# Patient Record
Sex: Female | Born: 1985 | Smoking: Never smoker
Health system: Southern US, Community
[De-identification: ages and names within clinical notes are randomized; demographics above are authoritative.]

## PROBLEM LIST (undated history)

## (undated) DIAGNOSIS — Z8742 Personal history of other diseases of the female genital tract: Secondary | ICD-10-CM

## (undated) DIAGNOSIS — F988 Other specified behavioral and emotional disorders with onset usually occurring in childhood and adolescence: Secondary | ICD-10-CM

---

## 2006-01-23 ENCOUNTER — Emergency Department: Payer: Self-pay | Admitting: Emergency Medicine

## 2006-03-17 ENCOUNTER — Emergency Department (HOSPITAL_COMMUNITY): Admission: EM | Admit: 2006-03-17 | Discharge: 2006-03-17 | Payer: Self-pay | Admitting: Emergency Medicine

## 2006-09-12 ENCOUNTER — Emergency Department (HOSPITAL_COMMUNITY): Admission: EM | Admit: 2006-09-12 | Discharge: 2006-09-12 | Payer: Self-pay | Admitting: Emergency Medicine

## 2007-07-31 IMAGING — CT CT HEAD W/O CM
1 series · 16 of 30 positions shown, 20 images · non-contrast
Comparison: None.

CLINICAL DATA: Headache and blurred vision after falling and hitting the left
side of her head last night.

HEAD CT WITHOUT CONTRAST
TECHNIQUE: 5mm collimated images were obtained from the base of the skull
through the vertex, according to standard protocol, without contrast.

[Series 2: headseq 4.8 h45s · axial · 0.43mm/px · z∈[-155,-27]mm · 16 of 30 slices shown, 20 images]
[im 2/30  brain]
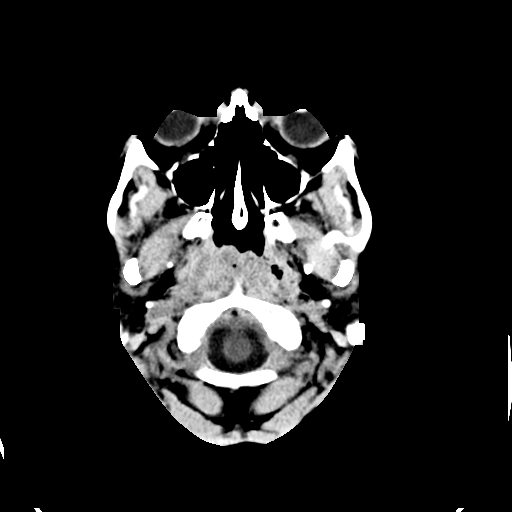
[im 2/30  bone]
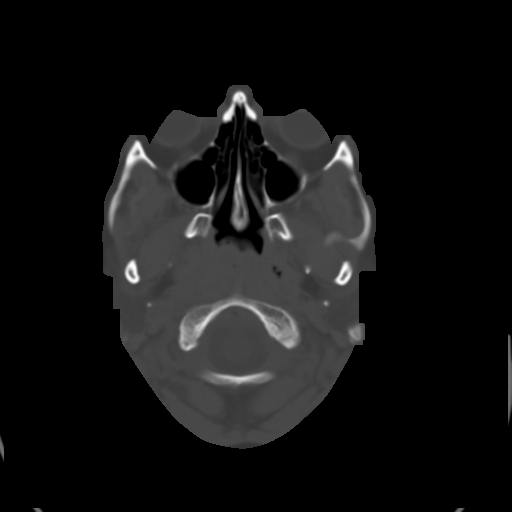
[im 4/30  brain]
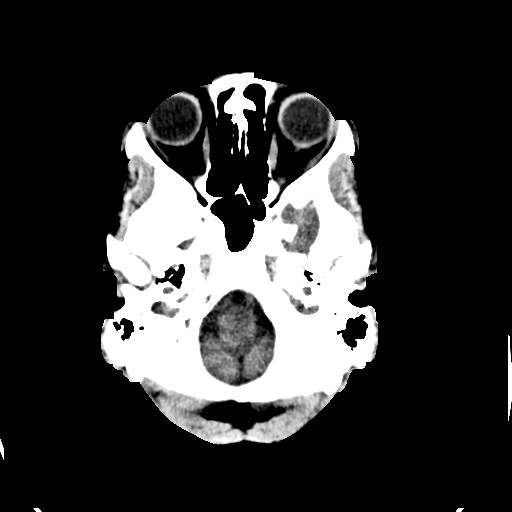
[im 6/30  brain]
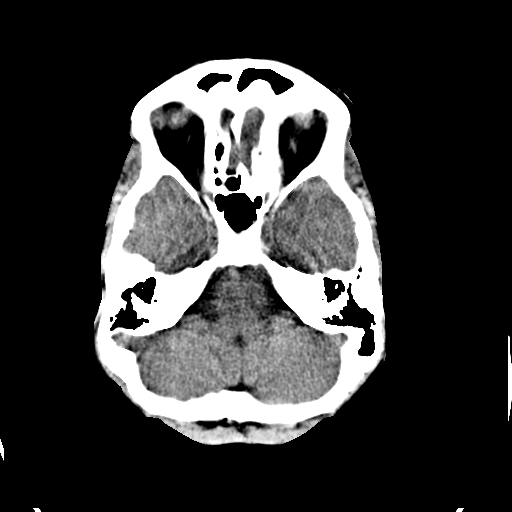
[im 8/30  brain]
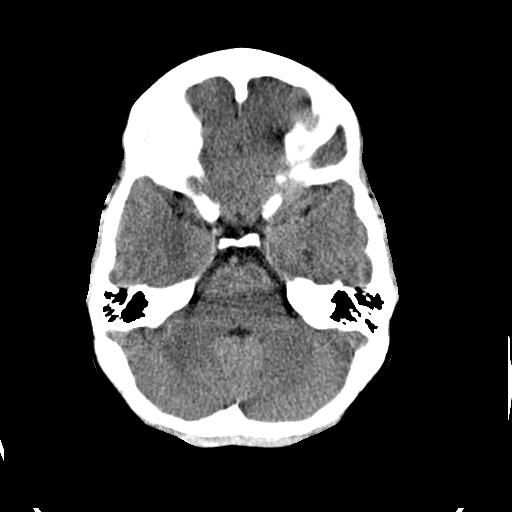
[im 9/30  brain]
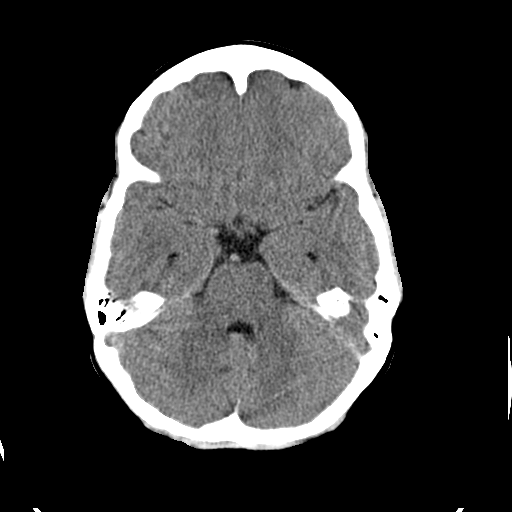
[im 9/30  bone]
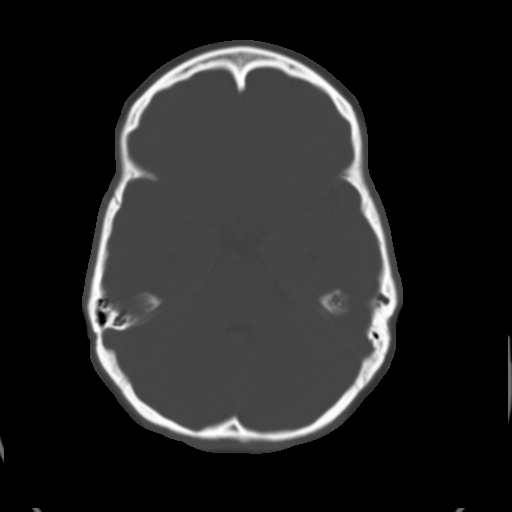
[im 11/30  brain]
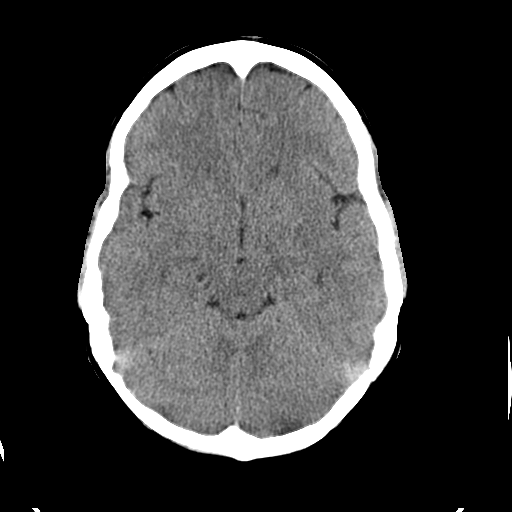
[im 13/30  brain]
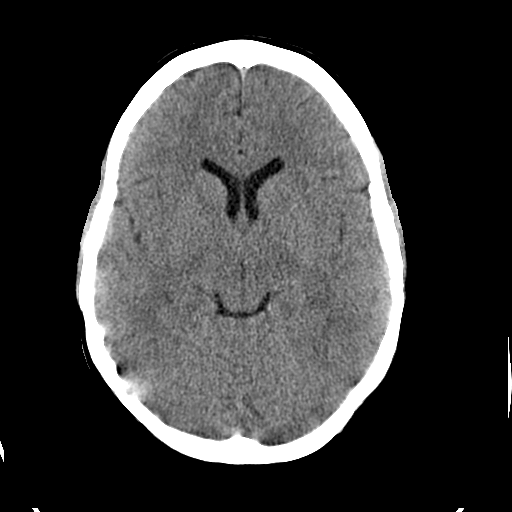
[im 15/30  brain]
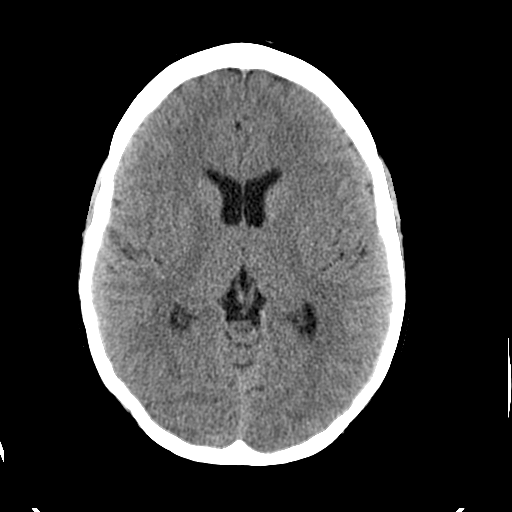
[im 16/30  brain]
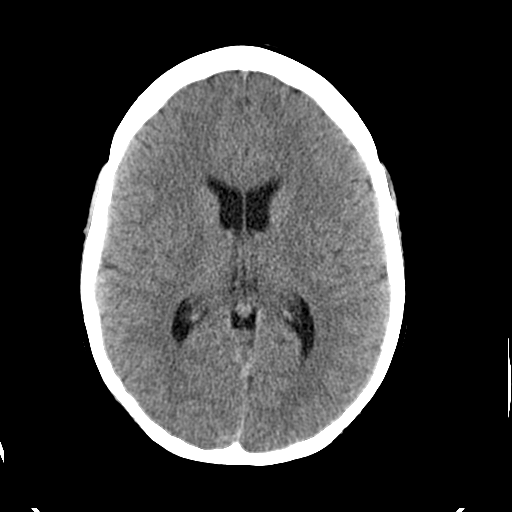
[im 16/30  bone]
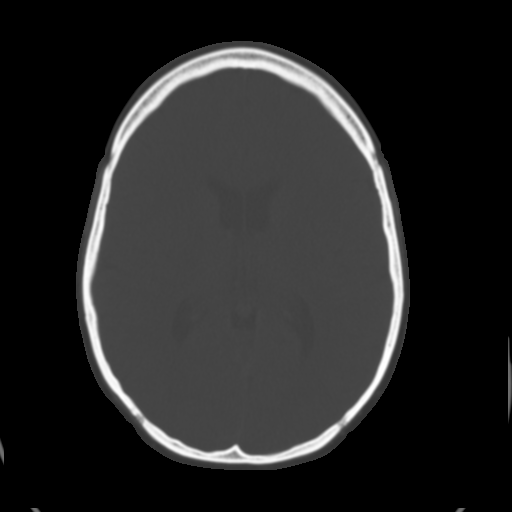
[im 18/30  brain]
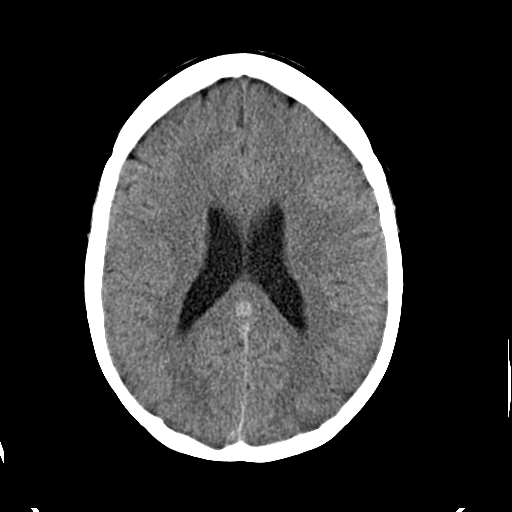
[im 20/30  brain]
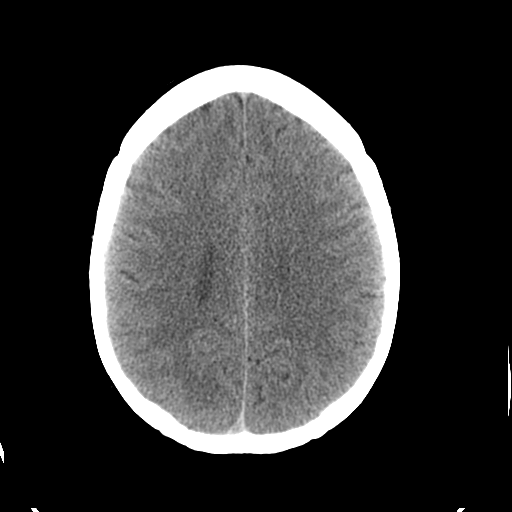
[im 22/30  brain]
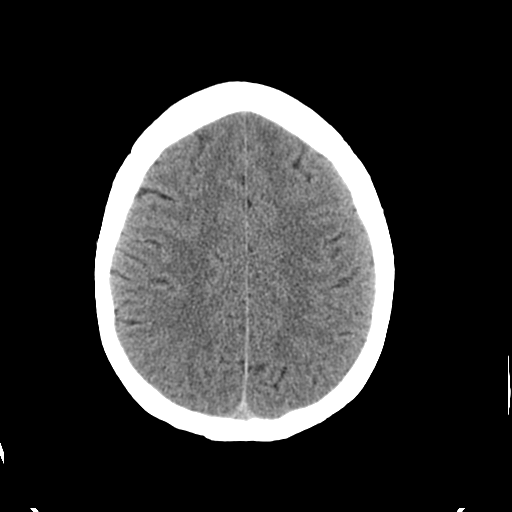
[im 23/30  brain]
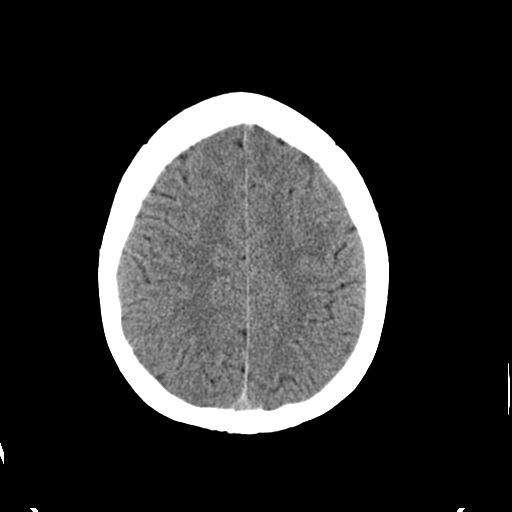
[im 23/30  bone]
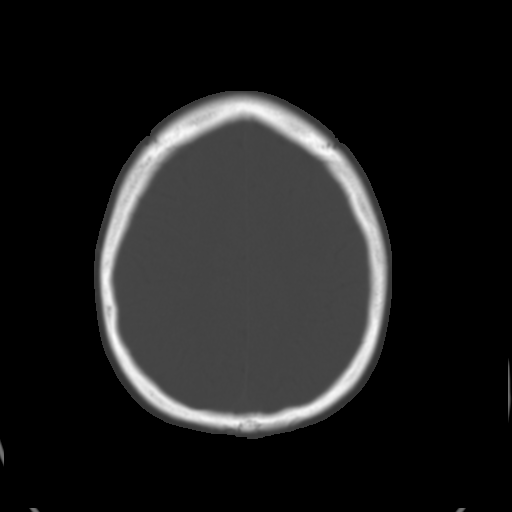
[im 25/30  brain]
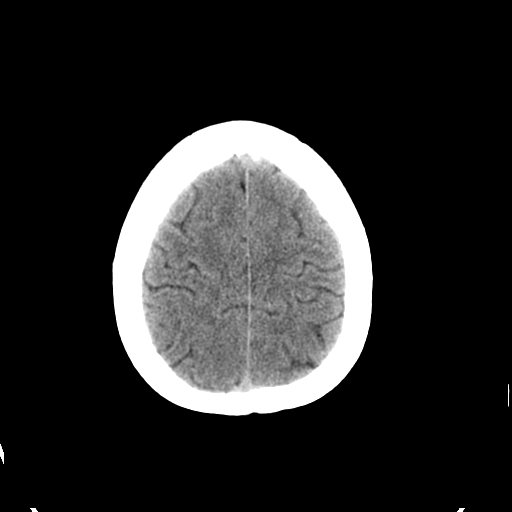
[im 27/30  brain]
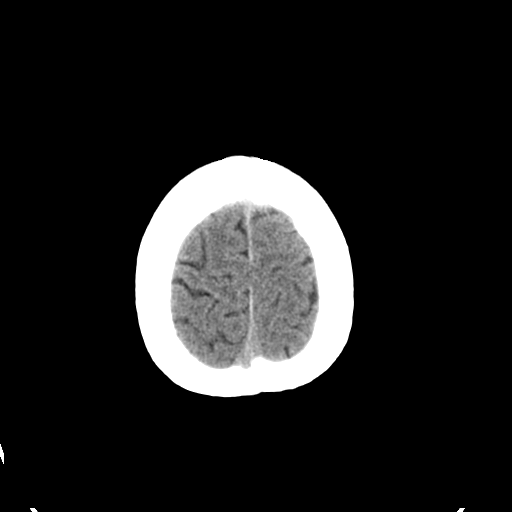
[im 29/30  brain]
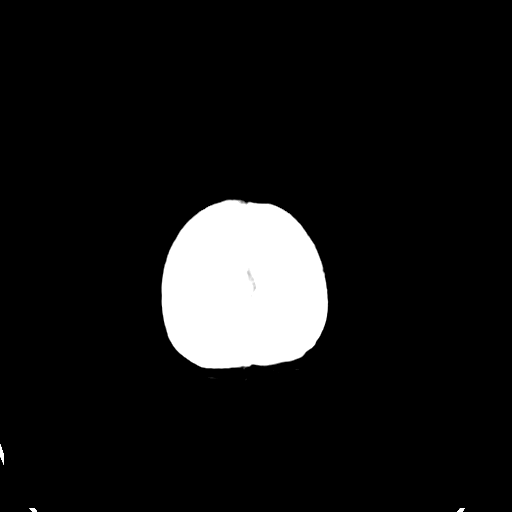

[16 of 30 positions shown; findings below may reference images not displayed]

FINDINGS: Normal appearing cerebral hemispheres and posterior fossa structures.
Normal size and position of the ventricles.  No skull fracture, intracranial
hemorrhage, mass effect or paranasal sinus air/fluid levels.

IMPRESSION

Normal examination.

## 2007-10-03 DIAGNOSIS — F988 Other specified behavioral and emotional disorders with onset usually occurring in childhood and adolescence: Secondary | ICD-10-CM

## 2007-10-03 HISTORY — DX: Other specified behavioral and emotional disorders with onset usually occurring in childhood and adolescence: F98.8

## 2018-10-01 ENCOUNTER — Inpatient Hospital Stay (HOSPITAL_COMMUNITY)
Admission: AD | Admit: 2018-10-01 | Discharge: 2018-10-01 | Disposition: A | Discharge status: Home / Self Care | Payer: Managed Care, Other (non HMO) | Attending: Obstetrics & Gynecology | Admitting: Obstetrics & Gynecology

## 2018-10-01 ENCOUNTER — Other Ambulatory Visit: Payer: Self-pay

## 2018-10-01 ENCOUNTER — Encounter (HOSPITAL_COMMUNITY): Payer: Self-pay | Admitting: *Deleted

## 2018-10-01 DIAGNOSIS — N939 Abnormal uterine and vaginal bleeding, unspecified: Secondary | ICD-10-CM | POA: Insufficient documentation

## 2018-10-01 DIAGNOSIS — T839XXA Unspecified complication of genitourinary prosthetic device, implant and graft, initial encounter: Secondary | ICD-10-CM | POA: Insufficient documentation

## 2018-10-01 HISTORY — DX: Other specified behavioral and emotional disorders with onset usually occurring in childhood and adolescence: F98.8

## 2018-10-01 LAB — CBC
HCT: 40.3 % (ref 36.0–46.0)
Hemoglobin: 12.9 g/dL (ref 12.0–15.0)
MCH: 29.1 pg (ref 26.0–34.0)
MCHC: 32 g/dL (ref 30.0–36.0)
MCV: 90.8 fL (ref 80.0–100.0)
PLATELETS: 304 10*3/uL (ref 150–400)
RBC: 4.44 MIL/uL (ref 3.87–5.11)
RDW: 13 % (ref 11.5–15.5)
WBC: 6.5 10*3/uL (ref 4.0–10.5)
nRBC: 0 % (ref 0.0–0.2)

## 2018-10-01 LAB — POCT PREGNANCY, URINE: PREG TEST UR: NEGATIVE

## 2018-10-01 MED ORDER — KETOROLAC TROMETHAMINE 60 MG/2ML IM SOLN
60.0000 mg | Freq: Once | INTRAMUSCULAR | Status: AC
  Administered 2018-10-01: 60 mg via INTRAMUSCULAR
  Filled 2018-10-01: qty 2

## 2018-10-01 MED ORDER — NORGESTIMATE-ETH ESTRADIOL 0.25-35 MG-MCG PO TABS: 1.0000 | ORAL_TABLET | Freq: Every day | ORAL | 1 refills | Status: AC

## 2018-10-01 NOTE — MAU Note (Signed)
Pt stated she started what she though was her normal period. Has Jean Bates. Periods have been heavy (Placed October 2019) since it was place. Pt stated for the pass fer days she has been changing tampons hourly. This morning she passed 2 large blood clots and saw her IUD in one of them. Having some abd pain and cramping.

## 2018-10-01 NOTE — Discharge Instructions (Signed)
Abnormal Uterine Bleeding  Abnormal uterine bleeding means bleeding more than usual from your uterus. It can include:   Bleeding between periods.   Bleeding after sex.   Bleeding that is heavier than normal.   Periods that last longer than usual.   Bleeding after you have stopped having your period (menopause).  There are many problems that may cause this. You should see a doctor for any kind of bleeding that is not normal. Treatment depends on the cause of the bleeding.  Follow these instructions at home:   Watch your condition for any changes.   Do not use tampons, douche, or have sex, if your doctor tells you not to.   Change your pads often.   Get regular well-woman exams. Make sure they include a pelvic exam and cervical cancer screening.   Keep all follow-up visits as told by your doctor. This is important.  Contact a doctor if:   The bleeding lasts more than one week.   You feel dizzy at times.   You feel like you are going to throw up (nauseous).   You throw up.  Get help right away if:   You pass out.   You have to change pads every hour.   You have belly (abdominal) pain.   You have a fever.   You get sweaty.   You get weak.   You passing large blood clots from your vagina.  Summary   Abnormal uterine bleeding means bleeding more than usual from your uterus.   There are many problems that may cause this. You should see a doctor for any kind of bleeding that is not normal.   Treatment depends on the cause of the bleeding.  This information is not intended to replace advice given to you by your health care provider. Make sure you discuss any questions you have with your health care provider.  Document Released: 07/16/2009 Document Revised: 09/12/2016 Document Reviewed: 09/12/2016  Elsevier Interactive Patient Education  2019 [employer] Inc[employer]

## 2018-10-01 NOTE — MAU Provider Note (Signed)
History     CSN: [id number]  Arrival date and time: 10/01/18 [id number]   First Provider Initiated Contact with Patient 10/01/18 [phone]      Chief Complaint  Patient presents with  . Vaginal Bleeding   HPI   Ms.Jean Bates is a 32 y.o. female [id number] non pregnant female here with vaginal bleeding. She had a kylena IUD placed in October and since then has had very heavy menstrual cycles since then. 2 days she started having heavier bleeding; cramping in her abdomen/hips and back. She does have a history of a ovarian cysts. Yesterday she continued to pass clots, however her pain had improved. She saw her IUD come out in the toilet and this morning bled through her clothes at work.  Since the IUD came out her pain has improved.   OB History    Gravida  1   Para  0   Term      Preterm      AB  1   Living        SAB      TAB      Ectopic      Multiple      Live Births              Past Medical History:  Diagnosis Date  . ADD (attention deficit disorder) 2009      No family history on file.  Social History   Tobacco Use  . Smoking status: Not on file  Substance Use Topics  . Alcohol use: Not on file  . Drug use: Not on file    Allergies: Not on File  Medications Prior to Admission  Medication Sig Dispense Refill Last Dose  . acetaminophen (TYLENOL) 500 MG tablet Take 500 mg by mouth every 6 (six) hours as needed.   12/29  . ibuprofen (ADVIL,MOTRIN) 800 MG tablet Take 800 mg by mouth every 8 (eight) hours as needed.   12/29   Results for orders placed or performed during the hospital encounter of 10/01/18 (from the past 48 hour(s))  Pregnancy, urine POC     Status: None   Collection Time: 10/01/18  8:24 AM  Result Value Ref Range   Preg Test, Ur NEGATIVE NEGATIVE    Comment:        THE SENSITIVITY OF THIS METHODOLOGY IS >24 mIU/mL   CBC     Status: None   Collection Time: 10/01/18  8:54 AM  Result Value Ref Range   WBC 6.5 4.0 - 10.5 K/uL   RBC  4.44 3.87 - 5.11 MIL/uL   Hemoglobin 12.9 12.0 - 15.0 g/dL   HCT 30.840.3 65.736.0 - 84.646.0 %   MCV 90.8 80.0 - 100.0 fL   MCH 29.1 26.0 - 34.0 pg   MCHC 32.0 30.0 - 36.0 g/dL   RDW 96.213.0 95.211.5 - 84.115.5 %   Platelets 304 150 - 400 K/uL   nRBC 0.0 0.0 - 0.2 %    Comment: Performed at [facility], [address] [place], [place] [address]   Review of Systems  Genitourinary: Positive for vaginal bleeding.  Neurological: Negative for dizziness.   Physical Exam   Blood pressure 108/81, pulse 77, temperature 98.1 F (36.7 C), temperature source Oral, resp. rate 18, last menstrual period 09/27/2018.  Physical Exam  Constitutional: She is oriented to person, place, and time. She appears well-developed and well-nourished. No distress.  HENT:  Head: Normocephalic.  Eyes: Pupils are equal, round, and reactive to light.  GI: Soft. She exhibits no distension and no mass. There is no abdominal tenderness. There is no rebound and no guarding.  Genitourinary:    Rectum normal.  Rectum:     No rectal mass, tenderness, external hemorrhoid, internal hemorrhoid or abnormal anal tone.     Genitourinary Comments: Vagina - Small amount of dark red blood noted in vault. Small pea size clots  Cervix - small amount of blood oozing from cervix.  Bimanual exam: Cervix closed Uterus non tender, normal size Adnexa non tender, no masses bilaterally GC/Chlam, wet prep done mislabeled by RN, not sent  Chaperone present for exam.    Musculoskeletal: Normal range of motion.  Neurological: She is alert and oriented to person, place, and time.  Skin: Skin is warm. She is not diaphoretic.  Psychiatric: Her behavior is normal.    MAU Course  Procedures  None  MDM  CBC and Hgb stable IUD is out prior to arrival Toradol 60 mg given IM, pain down from 3/10 to 1/10 Patient is scheduled with her GYN on Thursday in [place].   Assessment and Plan   A:  1. Vaginal bleeding, abnormal   2. Complication of  intrauterine device (IUD), unspecified complication, initial encounter (HCC)     P:  Discharge home in stable condition Bleeding precautions Rx: Sprintec, double pills for 5-7 days then take 1 pill daily Return to MAU if symptoms worsen Keep your appointment with GYN  Rasch, Harolyn RutherfordJennifer I, NP 10/01/2018 7:33 PM

## 2019-04-06 ENCOUNTER — Other Ambulatory Visit: Payer: Self-pay

## 2019-04-06 ENCOUNTER — Encounter: Payer: Self-pay | Admitting: Emergency Medicine

## 2019-04-06 ENCOUNTER — Emergency Department
Admission: EM | Admit: 2019-04-06 | Discharge: 2019-04-06 | Disposition: A | Discharge status: Home / Self Care | Payer: Managed Care, Other (non HMO) | Attending: Emergency Medicine | Admitting: Emergency Medicine

## 2019-04-06 ENCOUNTER — Emergency Department: Payer: Managed Care, Other (non HMO)

## 2019-04-06 DIAGNOSIS — R1032 Left lower quadrant pain: Secondary | ICD-10-CM | POA: Insufficient documentation

## 2019-04-06 DIAGNOSIS — N200 Calculus of kidney: Secondary | ICD-10-CM

## 2019-04-06 DIAGNOSIS — R112 Nausea with vomiting, unspecified: Secondary | ICD-10-CM | POA: Diagnosis not present

## 2019-04-06 HISTORY — DX: Personal history of other diseases of the female genital tract: Z87.42

## 2019-04-06 LAB — URINALYSIS, COMPLETE (UACMP) WITH MICROSCOPIC
Bilirubin Urine: NEGATIVE
Glucose, UA: NEGATIVE mg/dL
Ketones, ur: 5 mg/dL — AB
Leukocytes,Ua: NEGATIVE
Nitrite: NEGATIVE
Protein, ur: NEGATIVE mg/dL
RBC / HPF: >50 RBC/hpf — ABNORMAL HIGH (ref 0–5)
Specific Gravity, Urine: 1.015 (ref 1.005–1.030)
pH: 5 (ref 5.0–8.0)

## 2019-04-06 LAB — COMPREHENSIVE METABOLIC PANEL
ALT: 20 U/L (ref 0–44)
AST: 16 U/L (ref 15–41)
Albumin: 4.1 g/dL (ref 3.5–5.0)
Alkaline Phosphatase: 57 U/L (ref 38–126)
Anion gap: 9 (ref 5–15)
BUN: 15 mg/dL (ref 6–20)
CO2: 21 mmol/L — ABNORMAL LOW (ref 22–32)
Calcium: 9.1 mg/dL (ref 8.9–10.3)
Chloride: 112 mmol/L — ABNORMAL HIGH (ref 98–111)
Creatinine, Ser: 0.55 mg/dL (ref 0.44–1.00)
GFR calc Af Amer: >60 mL/min (ref >60)
GFR calc non Af Amer: >60 mL/min (ref >60)
Glucose, Bld: 102 mg/dL — ABNORMAL HIGH (ref 70–99)
Potassium: 3.8 mmol/L (ref 3.5–5.1)
Sodium: 142 mmol/L (ref 135–145)
Total Bilirubin: 0.4 mg/dL (ref 0.3–1.2)
Total Protein: 7 g/dL (ref 6.5–8.1)

## 2019-04-06 LAB — CBC
HCT: 40.4 % (ref 36.0–46.0)
Hemoglobin: 13.3 g/dL (ref 12.0–15.0)
MCH: 28.6 pg (ref 26.0–34.0)
MCHC: 32.9 g/dL (ref 30.0–36.0)
MCV: 86.9 fL (ref 80.0–100.0)
Platelets: 311 10*3/uL (ref 150–400)
RBC: 4.65 MIL/uL (ref 3.87–5.11)
RDW: 12.7 % (ref 11.5–15.5)
WBC: 7 10*3/uL (ref 4.0–10.5)
nRBC: 0 % (ref 0.0–0.2)

## 2019-04-06 LAB — PREGNANCY, URINE: Preg Test, Ur: NEGATIVE

## 2019-04-06 MED ORDER — ONDANSETRON HCL 4 MG/2ML IJ SOLN
4.0000 mg | Freq: Once | INTRAMUSCULAR | Status: DC | PRN
  Filled 2019-04-06: qty 2

## 2019-04-06 MED ORDER — ONDANSETRON 4 MG PO TBDP: 4.0000 mg | ORAL_TABLET | Freq: Four times a day (QID) | ORAL | 0 refills | Status: AC | PRN

## 2019-04-06 MED ORDER — ONDANSETRON HCL 4 MG/2ML IJ SOLN
4.0000 mg | Freq: Once | INTRAMUSCULAR | Status: AC
  Administered 2019-04-06: 4 mg via INTRAVENOUS
  Filled 2019-04-06: qty 2

## 2019-04-06 MED ORDER — KETOROLAC TROMETHAMINE 30 MG/ML IJ SOLN
15.0000 mg | Freq: Once | INTRAMUSCULAR | Status: AC
  Administered 2019-04-06: 15 mg via INTRAVENOUS
  Filled 2019-04-06: qty 1

## 2019-04-06 MED ORDER — MORPHINE SULFATE (PF) 2 MG/ML IV SOLN
2.0000 mg | Freq: Once | INTRAVENOUS | Status: AC
  Administered 2019-04-06: 2 mg via INTRAVENOUS
  Filled 2019-04-06: qty 1

## 2019-04-06 MED ORDER — SODIUM CHLORIDE 0.9 % IV BOLUS
1000.0000 mL | Freq: Once | INTRAVENOUS | Status: AC
  Administered 2019-04-06: 1000 mL via INTRAVENOUS

## 2019-04-06 MED ORDER — MORPHINE SULFATE (PF) 4 MG/ML IV SOLN
INTRAVENOUS | Status: AC
  Filled 2019-04-06: qty 1

## 2019-04-06 NOTE — Discharge Instructions (Signed)
You have been seen in the Emergency Department (ED) today for pain that we believe based on your workup, is caused by kidney stones.  As we have discussed, please drink plenty of fluids.  Please make a follow up appointment with the physician(s) listed elsewhere in this documentation.  You may take pain medication as needed but ONLY as prescribed.  We also recommend that you take over-the-counter ibuprofen regularly according to label instructions over the next 5 days.  Take it with meals to minimize stomach discomfort.   Return to the Emergency Department (ED) or call your doctor if you have any worsening pain, fever, painful urination, are unable to urinate, or develop other symptoms that concern you.

## 2019-04-06 NOTE — ED Provider Notes (Signed)
[facility] Emergency Department Provider Note  ____________________________________________   First MD Initiated Contact with Patient 04/06/19 [phone]     (approximate)  I have reviewed the triage vital signs and the nursing notes.   HISTORY  Chief Complaint Pelvic Pain    HPI Jean Bates is a 33 y.o. female's medical history notably includes bilateral ovarian cyst but for which she has never had to have a surgical intervention.  She presents tonight for left-sided pain that seems to radiate from the left lower quadrant through to her back.  It is accompanied with nausea and vomiting.  She says that about 24 hours ago the symptoms started all of a sudden awoke her from sleep.  They were severe for a while and the pain was sharp and stabbing but then it got better.  She continued to have a feeling of some discomfort through the course of the day yesterday but felt well enough to continue on with her usual activities.  However she was asleep this morning when the pain came back and woke her up from sleep again at about 5:00 AM.  When the pain did not go away after little over an hour she came in for evaluation.  She currently says the pain is severe and she feels better when she is sitting up, worse when she lies flat.  It is difficult to find a position of comfort.  She is still nauseated although no longer actively vomiting.  She has had no hematuria and she had her last menstrual period about 3 weeks ago.  She denies fever, sore throat, cough, shortness of breath, and any other abdominal pain.  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  She has no other signs or symptoms except as described above.        Past Medical History:  Diagnosis Date  . ADD (attention deficit disorder) 2009  . History of ovarian cyst     There are no active problems to  display for this patient.   History reviewed. No pertinent surgical history.  Prior to Admission medications   Medication Sig Start Date End Date Taking? Authorizing Provider  acetaminophen (TYLENOL) 500 MG tablet Take 500 mg by mouth every 6 (six) hours as needed.    [provider]  ibuprofen (ADVIL,MOTRIN) 800 MG tablet Take 800 mg by mouth every 8 (eight) hours as needed.    [provider]  norgestimate-ethinyl estradiol (ORTHO-CYCLEN,SPRINTEC,PREVIFEM) 0.25-35 MG-MCG tablet Take 1 tablet by mouth daily. 10/01/18   Rasch, Harolyn RutherfordJennifer I, NP    Allergies Penicillins  History reviewed. No pertinent family history.  Social History Social History   Tobacco Use  . Smoking status: Never Smoker  . Smokeless tobacco: Never Used  Substance Use Topics  . Alcohol use: Not on file  . Drug use: Not on file    Review of Systems Constitutional: No fever/chills Eyes: No visual changes. ENT: No sore throat. Cardiovascular: Denies chest pain. Respiratory: Denies shortness of breath. Gastrointestinal: Left lower quadrant pain as described above associated with nausea and vomiting and radiating to her left flank. Genitourinary: Negative for dysuria and hematuria. Musculoskeletal: Negative for neck pain.  Negative for back pain. Integumentary: Negative for rash. Neurological: Negative for headaches, focal weakness or numbness.   ____________________________________________   PHYSICAL EXAM:  VITAL SIGNS: ED Triage Vitals  Enc Vitals Group  BP 04/06/19 0611 130/90     Pulse Rate 04/06/19 0611 90     Resp 04/06/19 0611 16     Temp 04/06/19 0611 98.1 F (36.7 C)     Temp Source 04/06/19 0611 Oral     SpO2 04/06/19 0611 100 %     Weight 04/06/19 0612 67.6 kg (149 lb)     Height 04/06/19 0612 1.626 m (5\' 4" )     Head Circumference --      Peak Flow --      Pain Score 04/06/19 0611 8     Pain Loc --      Pain Edu? --      Excl. in [place]? --     Constitutional:  Alert and oriented.  Generally well-appearing but does appear acutely uncomfortable and in pain. Eyes: Conjunctivae are normal.  Head: Atraumatic. Nose: No congestion/rhinnorhea. Mouth/Throat: Mucous membranes are moist. Neck: No stridor.  No meningeal signs.   Cardiovascular: Normal rate, regular rhythm. Good peripheral circulation. Grossly normal heart sounds. Respiratory: Normal respiratory effort.  No retractions. No audible wheezing. Gastrointestinal: The patient has tenderness to palpation of the low left lower quadrant but no localized peritonitis.  No rebound or guarding.  She has some minimal CVA tenderness to percussion on the left. GU: Deferred upon joint decision making to the patient myself. Musculoskeletal: No lower extremity tenderness nor edema. No gross deformities of extremities. Neurologic:  Normal speech and language. No gross focal neurologic deficits are appreciated.  Skin:  Skin is warm, dry and intact. No rash noted. Psychiatric: Mood and affect are normal. Speech and behavior are normal.  ____________________________________________   LABS (all labs ordered are listed, but only abnormal results are displayed)  Labs Reviewed  URINALYSIS, COMPLETE (UACMP) WITH MICROSCOPIC - Abnormal; Notable for the following components:      Result Value   Color, Urine YELLOW (*)    APPearance CLEAR (*)    Hgb urine dipstick LARGE (*)    Ketones, ur 5 (*)    RBC / HPF >50 (*)    Bacteria, UA RARE (*)    All other components within normal limits  CBC  PREGNANCY, URINE  COMPREHENSIVE METABOLIC PANEL  POC URINE PREG, ED   ____________________________________________  EKG  None - EKG not ordered by ED physician ____________________________________________  RADIOLOGY   ED MD interpretation: CT renal stone protocol pending  Official radiology report(s): No results found.  ____________________________________________   PROCEDURES   Procedure(s) performed  (including Critical Care):  Procedures   ____________________________________________   INITIAL IMPRESSION / MDM / [place] / ED COURSE  As part of my medical decision making, I reviewed the following data within the [place] notes reviewed and incorporated, Labs reviewed , Old chart reviewed, Patient signed out to Dr. Jacqualine Code, Notes from prior ED visits and [place] Controlled Substance Database        Differential diagnosis includes, but is not limited to, ureteral/renal colic, ovarian torsion, ovarian cyst with some pelvic free fluid, diverticulitis, UTI/pyelonephritis, less likely STD/PID.  The patient reports no new sexual partners and is having no vaginal complaints or concerns.  The onset of the pain is acute and sharp and intermittent for about 24 hours, worse at night.  Although torsion certainly possible, particularly given her history of problems with pain from cyst, even though she is clearly uncomfortable, she does not appear to be in as much pain as I would expect from an acute ovarian torsion  and less it is simply intermittent and it is not currently torsed.  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  Similarly, we discussed performing a pelvic exam, but she is not concerned about STDs and the presentation of her symptoms makes it seems very unlikely that this would be the result of a sexually transmitted infection and a pelvic exam would be of limited utility.  There is no indication of any respiratory symptoms.  The patient is a Engineer, civil (consulting)nurse at [employer] and thus exposed regularly to COVID-19 patients, but she has no signs or symptoms and there is no indication for testing at this time unless she requires surgical  intervention.  She is comfortable holding off on testing currently.  I am transferring ED care to Dr. Fanny BienQuale to follow-up labs and CT scan.  My recommendation is that the patient received a pelvic ultrasound if the CT scan does not show a definitive source for the pain, but he may reassess and come to a different conclusion.  She is receiving morphine 2 mg IV (she says she is very sensitive to morphine and does not want a full 4 doses) and Zofran 4 mg IV.  Clinical Course as of Apr 05 657  Wynelle LinkSun Apr 06, 2019  0656 Hematuria, no sign of infection.  CBC normal.  Urinalysis, Complete w Microscopic(!) [CF]    Clinical Course User Index [CF] Loleta RoseForbach, Donnice Nielsen, MD     ____________________________________________  FINAL CLINICAL IMPRESSION(S) / ED DIAGNOSES  Final diagnoses:  LLQ pain  Nausea and vomiting, intractability of vomiting not specified, unspecified vomiting type     MEDICATIONS GIVEN DURING THIS VISIT:  Medications  ondansetron (ZOFRAN) injection 4 mg (has no administration in time range)  morphine 2 MG/ML injection 2 mg (2 mg Intravenous Given 04/06/19 0645)     ED Discharge Orders    None      *Please note:  Jean Bates was evaluated in Emergency Department on 04/06/2019 for the symptoms described in the history of present illness. She was evaluated in the context of the global COVID-19 pandemic, which necessitated consideration that the patient might be at risk for infection with the SARS-CoV-2 virus that causes COVID-19. Institutional protocols and algorithms that pertain to the evaluation of patients at risk for COVID-19 are in a state of rapid change based on information released by regulatory bodies including the CDC and federal and state organizations. These policies and algorithms were followed during the patient's care in the ED.  Some ED evaluations and interventions may be delayed as a result of limited staffing during the pandemic.*  Note:  This document was prepared  using Dragon voice recognition software and may include unintentional dictation errors.   Loleta RoseForbach, Latona Krichbaum, MD 04/06/19 [phone]

## 2019-04-06 NOTE — ED Notes (Signed)
Patient requesting fluids at this time. MD made aware

## 2019-04-06 NOTE — ED Triage Notes (Signed)
Pt states llq pain that radiates to left pelvis "that I can feel in my vagina" since yesterday. Pt states does have nausea, but denies vomiting, fever, diarrhea.

## 2019-04-06 NOTE — ED Provider Notes (Signed)
Ct Renal Stone Study  Result Date: 04/06/2019 CLINICAL DATA:  Left flank pain. EXAM: CT ABDOMEN AND PELVIS WITHOUT CONTRAST TECHNIQUE: Multidetector CT imaging of the abdomen and pelvis was performed following the standard protocol without IV contrast. COMPARISON:  None. FINDINGS: Lower chest: No acute abnormality. Hepatobiliary: No focal liver abnormality is seen. No gallstones, gallbladder wall thickening, or biliary dilatation. Pancreas: Unremarkable. No pancreatic ductal dilatation or surrounding inflammatory changes. Spleen: Normal in size without focal abnormality. Adrenals/Urinary Tract: The adrenal glands and right kidney are unremarkable. 4 mm calculus at the left UVJ with resultant mild hydroureteronephrosis. Partially duplicated left renal collecting system. Punctate nonobstructive calculus in the midpole of the left kidney. The bladder is decompressed. Stomach/Bowel: Stomach is within normal limits. Appendix appears normal. No evidence of bowel wall thickening, distention, or inflammatory changes. Vascular/Lymphatic: No significant vascular findings are present. No enlarged abdominal or pelvic lymph nodes. Reproductive: Small uterine fibroid. The bilateral adnexa are unremarkable. Other: No abdominal wall hernia or abnormality. No abdominopelvic ascites. No pneumoperitoneum. Musculoskeletal: No acute or significant osseous findings. IMPRESSION: 1. 4 mm calculus at the left UVJ with resultant mild hydroureteronephrosis. 2. Additional punctate nonobstructive left renal calculus. Electronically Signed   By: Titus Dubin M.D.   On: 04/06/2019 07:25     ----------------------------------------- 8:22 AM on 04/06/2019 -----------------------------------------  Patient reports pain is improved quite a bit but still present.  Discussed with her, we will trial half dose of IV Toradol.  She reports also some ongoing nausea which will give additional Zofran for.  Reviewed her CT scan, findings of the  kidney stone.  I discussed very careful return precautions with her including fever, myalgias, abnormal urine odor, signs or symptoms of infection she is in agreement.  We will send current urine for culture.  ----------------------------------------- 10:12 AM on 04/06/2019 -----------------------------------------  Patient reports pain much improved.  She is ready and like to be able to go home.  She does not wish for any prescription pain medication.  A friend will be picking her up today.  She does request Zofran has some lingering nausea but wishes to go home with prescription.  Return precautions and treatment recommendations and follow-up discussed with the patient who is agreeable with the plan.      Delman Kitten, MD 04/06/19 1013

## 2019-04-07 LAB — URINE CULTURE
Culture: <10000 — AB
Special Requests: NORMAL

## 2019-06-26 ENCOUNTER — Telehealth (INDEPENDENT_AMBULATORY_CARE_PROVIDER_SITE_OTHER): Payer: Self-pay | Admitting: Family Medicine

## 2019-06-26 ENCOUNTER — Ambulatory Visit (FREE_STANDING_LABORATORY_FACILITY): Payer: Self-pay

## 2019-06-26 ENCOUNTER — Encounter (INDEPENDENT_AMBULATORY_CARE_PROVIDER_SITE_OTHER): Payer: Self-pay | Admitting: Family Medicine

## 2019-06-26 DIAGNOSIS — R05 Cough: Secondary | ICD-10-CM

## 2019-06-26 DIAGNOSIS — Z1159 Encounter for screening for other viral diseases: Secondary | ICD-10-CM

## 2019-06-26 DIAGNOSIS — Z01818 Encounter for other preprocedural examination: Secondary | ICD-10-CM

## 2019-06-26 DIAGNOSIS — Z20822 Contact with and (suspected) exposure to covid-19: Secondary | ICD-10-CM

## 2019-06-26 DIAGNOSIS — R002 Palpitations: Secondary | ICD-10-CM

## 2019-06-26 DIAGNOSIS — R519 Headache, unspecified: Secondary | ICD-10-CM

## 2019-06-26 DIAGNOSIS — R059 Cough, unspecified: Secondary | ICD-10-CM

## 2019-06-26 DIAGNOSIS — Z20828 Contact with and (suspected) exposure to other viral communicable diseases: Secondary | ICD-10-CM

## 2019-06-26 DIAGNOSIS — R51 Headache: Secondary | ICD-10-CM

## 2019-06-26 NOTE — Progress Notes (Signed)
[facility]  PROGRESS NOTE     Patient: Becky Copeland   Date: 06/26/2019   MRN: [id number]     This visit is being conducted via video/telephone.  Verbal consent has been obtained from the patient to conduct a video/telephone visit to minimize exposure to COVID-19: yes    Workplace/Department:  [employer]  Role: Travel nurse, yesterday  Household contacts at high risk for severe COVID-19 infection: no    First day of symptoms:  06/25/2019  Last day of fever >100 F:  No fever    Becky Copeland is a 33 y.o. female     HISTORY     Chief Complaint   Patient presents with    Cough    Headache    Covid-19 Screening        Head since yesterday AM, drank fluid and ibuprofen but by end of day not not better, felt worse, pounding and with fatigue, neck glands feel swollen, dry cough, no sore throat.   Then palpitation last night.  Denies chest pain, lightheaded, dizzy, change in vision, weakness, numbness, tingling, leg pain, leg swelling, nausea, vomiting, diaphoresis.    Denies Fever, congestion, runny nose, mucus, sore throat, sneezing  Denies SOB/Chest pain/chest tightness/difficulty breathing.  Denies n/v/d/rash  Nl sense of smell and taste.    Denies travel    Denies problems with:  Allergies  Asthma  Reflux  DM  Heart  Lung   Kidney  Liver  Blood clot        Headache   This is a new problem. The current episode started yesterday. The problem occurs constantly. The pain is located in the bilateral region. Associated symptoms include coughing. Pertinent negatives include no abdominal pain, back pain, dizziness, eye pain, fever, nausea, neck pain, numbness, photophobia, rhinorrhea, sinus pressure, sore throat, vomiting or weakness.       Review of Systems   Constitutional: Positive for fatigue. Negative for activity change, chills, diaphoresis, fever and unexpected weight change.   HENT: Negative for congestion, postnasal drip, rhinorrhea, sinus pressure and sore throat.    Eyes: Negative for photophobia, pain and  visual disturbance.   Respiratory: Positive for cough. Negative for chest tightness, shortness of breath and wheezing.    Cardiovascular: Positive for palpitations. Negative for chest pain and leg swelling.   Gastrointestinal: Negative for abdominal pain, diarrhea, nausea and vomiting.   Genitourinary: Negative for difficulty urinating.   Musculoskeletal: Negative for back pain, gait problem, joint swelling, myalgias, neck pain and neck stiffness.   Skin: Negative for pallor and rash.   Neurological: Positive for headaches. Negative for dizziness, syncope, speech difficulty, weakness, light-headedness and numbness.   Hematological: Positive for adenopathy.   Psychiatric/Behavioral: Negative for confusion and sleep disturbance. The patient is not nervous/anxious.    All other systems reviewed and are negative.      No past medical history on file.    No past surgical history on file.    No family history on file.    Social History     Socioeconomic History    Marital status: Unknown     Spouse name: Not on file    Number of children: Not on file    Years of education: Not on file    Highest education level: Not on file   Occupational History    Not on file   Social Needs    Financial resource strain: Not on file    Food insecurity  Worry: Not on file     Inability: Not on file    Transportation needs     Medical: Not on file     Non-medical: Not on file   Tobacco Use    Smoking status: Never Smoker    Smokeless tobacco: Never Used   Substance and Sexual Activity    Alcohol use: Not on file     Comment: socially, 2-3 drinks    Drug use: Not Currently    Sexual activity: Not on file   Lifestyle    Physical activity     Days per week: Not on file     Minutes per session: Not on file    Stress: Not on file   Relationships    Social connections     Talks on phone: Not on file     Gets together: Not on file     Attends religious service: Not on file     Active member of club or organization: Not on file      Attends meetings of clubs or organizations: Not on file     Relationship status: Not on file    Intimate partner violence     Fear of current or ex partner: Not on file     Emotionally abused: Not on file     Physically abused: Not on file     Forced sexual activity: Not on file   Other Topics Concern    Not on file   Social History Narrative    Not on file       PCP:  Pcp, None, MD    History reviewed.      No current outpatient medications on file.    Allergies   Allergen Reactions    Penicillins Hives             PHYSICAL EXAM     There were no vitals filed for this visit.    Constitutional:       General: Not in acute distress.     Appearance: Normal appearance. Not ill-appearing or toxic-appearing.   HENT:      Head: Normocephalic and atraumatic.   Eyes:      Conjunctiva/sclera: Conjunctivae normal.   Neck:      Musculoskeletal: Normal range of motion.   Respiratory:      Normal effort. Able to speak in full sentences.  Neurological:      Mental Status: Alert and oriented.  Psychiatric:         Mood and Affect: Mood normal.         Behavior: Behavior normal.         MEDICAL DECISION MAKING       LABS     Results     ** No results found for the last 24 hours. **          ASSESSMENT     Encounter Diagnoses   Name Primary?    Cough Yes    Exposure to Covid-19 Virus     Acute nonintractable headache, unspecified headache type     Palpitations           ASSESSMENT    PLAN     Discussed different possibilities including heart, lung problem, blood clot, chest wall pain, reflux, infection, liver/gall bladder and anxiety.     Offered and declined in person visit and testing.  Agreed will follow up with primary care doctor for palpitation and headache.     Unclear cause of  palpitation  If palpitation worse, headache worse, chest pain, nausea, vomiting, sweaty, clammy, trouble breathing, dizziness, weakness, go to ER.      Covid test ordered.  Discussed false negative tests.    Please go to our [place] COVID  testing at [facility] at 11 AM in [place] [employer] [place] as discussed . When you arrive at the clinic please follow direction to curve side testing.  Stay in your car with window closed. At your appointment time, our staff will come out to test you and others in that same time block.    Please drive the same car that you've described.    [facility]  [address] [place], [place] [address]      Continue to monitor symptoms and treat symptoms as needed.    Fluid, rest.    If fever, take tylenol.    If cough and mucus, use guaifenesin with dextromorphan (like Robutussin DM or Mucinex DM).   Use cough drops.   Rest your voice.   Elevate head when sleeping - use recliner.     If sore throat, use warm salt water gargle or cloriseptic spray as needed.  Soft diet.  If sore throat worsens, your may need to go to one our Respiratory Urgent Care to be checked.    If diarrhea, no over the counter med - no Imodium.   Hydrate well - electrolyte/gatoraid/pedialyte  Avoid dairy, avoid greasy food.    If fever persist for more than 5 days, may need to be rechecked for possible pneumonia at one of our respiratory urgent care clinic in person.  [facility]   [address] [place], [place] [address]    Phone: [phone]    [facility]   [address] [place], [place] [address]  Phone: [phone]    If your breathing worsens, then you may have to go to ER.  Otherwise follow up with your doctor in 3-5 days to recheck.     According to the [place] guidelines for returning to work, you may return to work once the following conditions have been met:    If COVID-19 test is NEGATIVE, patient may return to work if symptoms resolved AND afebrile (Temp <100 F) for at least 24 hours without fever reducing medications.    If COVID-19 test is POSITIVE, patient may return to work 10 days from start of symptoms AND symptoms are improving AND afebrile (Temp <100 F) for at least 24 hours without fever reducing  medications.     Per CDC guidance, a time and symptom based approach is preferred over a testing based approach to determine when to discontinue isolation and return to work. A positive test result after clinical recovery unlikely means that the patient is still contagious. People may continue to test positive long after they recover from COVID-19. A negative result when symptoms and duration of illness are not factored in can provide false reassurance.    If COVID-19 positive, contact with severely immunocompromised patients (e.g., transplant, hematology-oncology, elderly, frail) may need to be restricted until 14 days after illness onset.  When back at work, you should wear a facemask at all times until all symptoms are completely resolved or for 14 days, whichever is longer.     Also, self-monitor for symptoms, and seek re-evaluation if respiratory symptoms recur or worsen.    We recommend self-quarantine in the home setting until the above conditions have been met in order to minimize potential  community spread of COVID-19.  This recommendation might change in the future based on the patient's symptoms and/or results of pertinent laboratory tests.      WHAT TO DO IF YOU HAVE CONFIRMED OR SUSPECTED CORONAVIRUS DISEASE (COVID-19)?    If you are sick with COVID-19 or think you might have the virus that causes COVID-19, follow the steps below to help prevent the disease from spreading to people in your home and your community.    The most common signs of COVID-19 are: fever, cough, and difficulty breathing. If you think you have been exposed to COVID-19 and are having these symptoms, consider calling your doctor or other healthcare provider for medical advice. Your doctor will decide if you need to be tested or seen in person. Keep in mind that there is no treatment for COVID-19. People who are mildly ill may not need to be tested and should isolate (keep away from other people) and care for themselves at home.     Take these steps to monitor your health while you stay home and practice social distancing:    Stay home, except to get medical care, get rest and drink plenty of fluids.  Even if you are only mildly ill with COVID-19, stay home while you are sick.   Get rest and drink plenty of fluids.  Do not go to work, school, or public areas.  Avoid using public transportation such as buses, trains, taxis or ride-shares.  If someone in your household has tested positive for COVID-19, the entire household should stay home for 14 days.    Isolation: Separate yourself from other people and animals in your home.  As much as possible, stay in a specific room away from others in your home. Use a separate bathroom, if available.  Avoid sharing personal household items such as dishes, drinking glasses, cups, utensils, towels, or bedding with other people or pets in your home. After using these items, they should be washed thoroughly with soap and water.  Restrict contact with pets and other animals while sick. Avoid petting, snuggling, being kissed or licked, and sharing food with your pet. When possible, have another member of your household care for your animals while you are sick. If you must care for your pet or be around animals while you are sick, wash your hands before and after you interact with pets and wear a facemask.    Call ahead before visiting a doctor and monitor your symptoms.  Seek prompt medical attention if your illness is getting worse (e.g., difficulty breathing)  If you have a medical appointment or are seeking care, call the doctors office and tell them you have, or may have COVID-19. Put on a facemask before you enter the building to help keep other people in the office or waiting room from sick.  If you need emergency medical care, call 911 and notify the dispatch personnel that you have, or may have COVID-19.   Emergency warning signs for COVID-19 may include: difficult breathing or shortness of breath, pain  or pressure in the chest that doesnt go away, new confusion or inability to arouse, or bluish lips or face.    Wear a facemask when around other people or pets.  If you are sick, you should wear a facemask when you are around other people (e.g., sharing a room or vehicle) or pets and before you enter a doctors office.  If the person who is sick is unable to wear a facemask (for example,  because it causes trouble breathing) then other household members should wear a facemask if they enter a room with the person who is sick.    Cover your coughs and sneezes and clean your hands often. Avoid sharing personal items.  Cover your mouth and nose with a tissue when you cough or sneeze and throw used tissues in a lined trashcan. Wash hands right after.  Wash hands often with soap and water for at least 20 seconds, especially after blowing your nose, coughing, sneezing; going to the bathroom; and before eating or preparing food.  If soap and water are not readily available, an alcohol-based hand sanitizer with at least 60% alcohol may be used. Cover all surfaces of your hands and rub them together until they feel dry.  Avoid touching your eyes, nose, and mouth with unwashed hands.    Clean all frequently touched surfaces daily.  Clean and disinfect all surfaces that get touched often, such as counters, tabletops, doorknobs, bathroom fixtures, toilets, phones, keyboards, tablets, and bedside tables.  Disinfect areas with bodily fluids such as blood, stool, or body fluids on them.  Use a household cleaning spray or wipe, according to the label instructions.  For more information on household cleaning and disinfection: PokerWatcher.com.ee.html    For more information:  Visit [place] Department of Healths [facility]) COVID-19 website at: PrimeLetters.ca  Visit [facility] COVID-19 website What To Do if You Are Sick at:  https://www.carlson.net/.html  Call [facility] COVID-19 hotline at 877-ASK-VDH3        Understanding Headache Pain  Headache pain can start in different structures in the head. The brain itself doesn't hurt, but other parts of the head do. Headache is a common symptom of illness, such as a cold or the flu. At other times, headacheshappen without seeming to be connected to any illness. These are known as primary headaches. Examples of primary headaches include migraine and tension headaches. Very rarely are headaches a sign of a serious medical problem.     What is referred pain?  Referred pain has its source in one place, but is felt in another. For example, pain behind the eyes may actually be caused by tense muscles in the neck and shoulders. This means that the place that hurts may not be the part of the head that needs treatment.   StayWell last reviewed this educational content on 01/30/2017   2000-2020 [employer], [place]. [address] [place], [place] [address]. All rights reserved. This information is not intended as a substitute for professional medical care. Always follow your healthcare professional's instructions.        Understanding Heart Palpitations    Heart palpitations are the feeling you have when your heartbeat seems to be racing, pounding, skipping, or fluttering. Heart palpitations are most often felt in the chest. Sometimes, they may also be felt in the neck.  What causes heart palpitations?  In most cases, heart palpitations are caused by:   Stress or anxiety   Exercise   Pregnancy   Some medicines   Caffeine   Nicotine   Alcohol   Illegal drugs, such as cocaine   Health problems, such as anemia or overactive thyroid  Many heart palpitations are harmless. But in some cases, palpitations may be caused by a problem with the heart such as an abnormal heart rhythm (arrhythmia). They may need to be managed by you and your healthcare provider or  treated right away.  How are heart palpitations treated?  Treatments for heart palpitations  depend on the cause. Options may include:   Managing the things that trigger your heart palpitations. This could mean:  ? Learning ways to reduce stress and anxiety  ? Staying away from caffeine, nicotine, alcohol, and illegal drugs  ? Stopping the use of certain medicines, under your doctors guidance   Medicines, procedures, or surgery to treat an arrhythmia or other health problem that is causing your symptoms  What are possible complications of heart palpitations?  Complications of heart palpitations are rare unless they are caused by a problem such as an arrhythmia. In such cases, complications can include:   Fainting   Heart failure. This problem occurs when the heart is so weak it no longer pumps blood well.   Blood clots and stroke   Sudden cardiac arrest. This problem occurs when the heart suddenly stops beating.  When should I call my healthcare provider?  Call your healthcare provider right away if you have any of these:   Palpitations that prevent you from sleeping or otherwise affect your quality of life.   Symptoms that dont get better with treatment, or symptoms that get worse   New symptoms, such as chest pain, shortness of breath, dizziness, or fainting  StayWell last reviewed this educational content on 03/02/2018   2000-2020 [employer], [place]. [address] [place], [place] [address]. All rights reserved. This information is not intended as a substitute for professional medical care. Always follow your healthcare professional's instructions.                     Discussed diagnosis and treatment with patient.  Reviewed warning signs for worsening condition as well as indications for follow-up with pmd, return to the urgent care center or emergency department.  Patient expressed understanding of instructions.    Patient was offered the alternative of an in person visits and they declined:  yes    Reviewed [place] parameters for return to work.      An After Visit Summary was transmitted to the patient via MyChart.      Time spent in discussion: 25 minutes        Signed,  Imogine Carvell Darl Pikes, MD

## 2019-06-26 NOTE — Patient Instructions (Addendum)
Discussed different possibilities including heart, lung problem, blood clot, chest wall pain, reflux, infection, liver/gall bladder and anxiety.     Offered and declined in person visit and testing.  Agreed will follow up with primary care doctor for palpitation and headache.     Unclear cause of palpitation  If palpitation worse, headache worse, chest pain, nausea, vomiting, sweaty, clammy, trouble breathing, dizziness, weakness, go to ER.      Covid test ordered.  Discussed false negative tests.    Please go to our [place] COVID testing at [facility] at 11 AM in [place] [employer] as discussed . When you arrive at the clinic please follow direction to curve side testing.  Stay in your car with window closed. At your appointment time, our staff will come out to test you and others in that same time block.    Please drive the same car that you've described.    [facility]  [address], [place], [place] [address]      Continue to monitor symptoms and treat symptoms as needed.    Fluid, rest.    If fever, take tylenol.    If cough and mucus, use guaifenesin with dextromorphan (like Robutussin DM or Mucinex DM).   Use cough drops.   Rest your voice.   Elevate head when sleeping - use recliner.     If sore throat, use warm salt water gargle or cloriseptic spray as needed.  Soft diet.  If sore throat worsens, your may need to go to one our Respiratory Urgent Care to be checked.    If diarrhea, no over the counter med - no Imodium.   Hydrate well - electrolyte/gatoraid/pedialyte  Avoid dairy, avoid greasy food.    If fever persist for more than 5 days, may need to be rechecked for possible pneumonia at one of our respiratory urgent care clinic in person.  [facility]   [address] [place], [place] [address]    Phone: [phone]    [facility]   [address] [place], [place] [address]  Phone: [phone]    If your breathing worsens, then you may have to go to ER.  Otherwise follow up  with your doctor in 3-5 days to recheck.     According to the [place] guidelines for returning to work, you may return to work once the following conditions have been met:    If COVID-19 test is NEGATIVE, patient may return to work if symptoms resolved AND afebrile (Temp <100 F) for at least 24 hours without fever reducing medications.    If COVID-19 test is POSITIVE, patient may return to work 10 days from start of symptoms AND symptoms are improving AND afebrile (Temp <100 F) for at least 24 hours without fever reducing medications.     Per CDC guidance, a time and symptom based approach is preferred over a testing based approach to determine when to discontinue isolation and return to work. A positive test result after clinical recovery unlikely means that the patient is still contagious. People may continue to test positive long after they recover from COVID-19. A negative result when symptoms and duration of illness are not factored in can provide false reassurance.    If COVID-19 positive, contact with severely immunocompromised patients (e.g., transplant, hematology-oncology, elderly, frail) may need to be restricted until 14 days after illness onset.  When back at work, you should wear a facemask at all times until all symptoms  are completely resolved or for 14 days, whichever is longer.     Also, self-monitor for symptoms, and seek re-evaluation if respiratory symptoms recur or worsen.    We recommend self-quarantine in the home setting until the above conditions have been met in order to minimize potential community spread of COVID-19.  This recommendation might change in the future based on the patient's symptoms and/or results of pertinent laboratory tests.      WHAT TO DO IF YOU HAVE CONFIRMED OR SUSPECTED CORONAVIRUS DISEASE (COVID-19)?    If you are sick with COVID-19 or think you might have the virus that causes COVID-19, follow the steps below to help prevent the disease from spreading to people in  your home and your community.    The most common signs of COVID-19 are: fever, cough, and difficulty breathing. If you think you have been exposed to COVID-19 and are having these symptoms, consider calling your doctor or other healthcare provider for medical advice. Your doctor will decide if you need to be tested or seen in person. Keep in mind that there is no treatment for COVID-19. People who are mildly ill may not need to be tested and should isolate (keep away from other people) and care for themselves at home.    Take these steps to monitor your health while you stay home and practice social distancing:    Stay home, except to get medical care, get rest and drink plenty of fluids.  Even if you are only mildly ill with COVID-19, stay home while you are sick.   Get rest and drink plenty of fluids.  Do not go to work, school, or public areas.  Avoid using public transportation such as buses, trains, taxis or ride-shares.  If someone in your household has tested positive for COVID-19, the entire household should stay home for 14 days.    Isolation: Separate yourself from other people and animals in your home.  As much as possible, stay in a specific room away from others in your home. Use a separate bathroom, if available.  Avoid sharing personal household items such as dishes, drinking glasses, cups, utensils, towels, or bedding with other people or pets in your home. After using these items, they should be washed thoroughly with soap and water.  Restrict contact with pets and other animals while sick. Avoid petting, snuggling, being kissed or licked, and sharing food with your pet. When possible, have another member of your household care for your animals while you are sick. If you must care for your pet or be around animals while you are sick, wash your hands before and after you interact with pets and wear a facemask.    Call ahead before visiting a doctor and monitor your symptoms.  Seek prompt medical  attention if your illness is getting worse (e.g., difficulty breathing)  If you have a medical appointment or are seeking care, call the doctors office and tell them you have, or may have COVID-19. Put on a facemask before you enter the building to help keep other people in the office or waiting room from sick.  If you need emergency medical care, call 911 and notify the dispatch personnel that you have, or may have COVID-19.   Emergency warning signs for COVID-19 may include: difficult breathing or shortness of breath, pain or pressure in the chest that doesnt go away, new confusion or inability to arouse, or bluish lips or face.    Wear a facemask when around other  people or pets.  If you are sick, you should wear a facemask when you are around other people (e.g., sharing a room or vehicle) or pets and before you enter a doctors office.  If the person who is sick is unable to wear a facemask (for example, because it causes trouble breathing) then other household members should wear a facemask if they enter a room with the person who is sick.    Cover your coughs and sneezes and clean your hands often. Avoid sharing personal items.  Cover your mouth and nose with a tissue when you cough or sneeze and throw used tissues in a lined trashcan. Wash hands right after.  Wash hands often with soap and water for at least 20 seconds, especially after blowing your nose, coughing, sneezing; going to the bathroom; and before eating or preparing food.  If soap and water are not readily available, an alcohol-based hand sanitizer with at least 60% alcohol may be used. Cover all surfaces of your hands and rub them together until they feel dry.  Avoid touching your eyes, nose, and mouth with unwashed hands.    Clean all frequently touched surfaces daily.  Clean and disinfect all surfaces that get touched often, such as counters, tabletops, doorknobs, bathroom fixtures, toilets, phones, keyboards, tablets, and bedside  tables.  Disinfect areas with bodily fluids such as blood, stool, or body fluids on them.  Use a household cleaning spray or wipe, according to the label instructions.  For more information on household cleaning and disinfection: PokerWatcher.com.ee.html    For more information:  Visit [place] [facility]) COVID-19 website at: PrimeLetters.ca  Visit [facility]Cs COVID-19 website What To Do if You Are Sick at: https://www.carlson.net/.html  Call [facility] COVID-19 hotline at 877-ASK-VDH3        Understanding Headache Pain  Headache pain can start in different structures in the head. The brain itself doesn't hurt, but other parts of the head do. Headache is a common symptom of illness, such as a cold or the flu. At other times, headacheshappen without seeming to be connected to any illness. These are known as primary headaches. Examples of primary headaches include migraine and tension headaches. Very rarely are headaches a sign of a serious medical problem.     What is referred pain?  Referred pain has its source in one place, but is felt in another. For example, pain behind the eyes may actually be caused by tense muscles in the neck and shoulders. This means that the place that hurts may not be the part of the head that needs treatment.   StayWell last reviewed this educational content on 01/30/2017   2000-2020 [employer], [place]. [address], [place], [place] [address]. All rights reserved. This information is not intended as a substitute for professional medical care. Always follow your healthcare professional's instructions.        Understanding Heart Palpitations    Heart palpitations are the feeling you have when your heartbeat seems to be racing, pounding, skipping, or fluttering. Heart palpitations are most often felt in the chest. Sometimes, they may also be felt in the neck.  What causes  heart palpitations?  In most cases, heart palpitations are caused by:   Stress or anxiety   Exercise   Pregnancy   Some medicines   Caffeine   Nicotine   Alcohol   Illegal drugs, such as cocaine   Health problems, such as anemia or overactive thyroid  Many heart palpitations are  harmless. But in some cases, palpitations may be caused by a problem with the heart such as an abnormal heart rhythm (arrhythmia). They may need to be managed by you and your healthcare provider or treated right away.  How are heart palpitations treated?  Treatments for heart palpitations depend on the cause. Options may include:   Managing the things that trigger your heart palpitations. This could mean:  ? Learning ways to reduce stress and anxiety  ? Staying away from caffeine, nicotine, alcohol, and illegal drugs  ? Stopping the use of certain medicines, under your doctors guidance   Medicines, procedures, or surgery to treat an arrhythmia or other health problem that is causing your symptoms  What are possible complications of heart palpitations?  Complications of heart palpitations are rare unless they are caused by a problem such as an arrhythmia. In such cases, complications can include:   Fainting   Heart failure. This problem occurs when the heart is so weak it no longer pumps blood well.   Blood clots and stroke   Sudden cardiac arrest. This problem occurs when the heart suddenly stops beating.  When should I call my healthcare provider?  Call your healthcare provider right away if you have any of these:   Palpitations that prevent you from sleeping or otherwise affect your quality of life.   Symptoms that dont get better with treatment, or symptoms that get worse   New symptoms, such as chest pain, shortness of breath, dizziness, or fainting  StayWell last reviewed this educational content on 03/02/2018   2000-2020 [employer], [place]. [address] [place], [place] [address]. All rights reserved. This  information is not intended as a substitute for professional medical care. Always follow your healthcare professional's instructions.

## 2019-06-27 LAB — COVID-19 (SARS-COV-2): SARS CoV 2 Overall Result: NOT DETECTED

## 2020-02-22 IMAGING — CT CT RENAL STONE PROTOCOL
3 of 4 series · 8 of 46 positions shown, 15 images · non-contrast
Comparison: None.

CLINICAL DATA: Left flank pain.

EXAM:
CT ABDOMEN AND PELVIS WITHOUT CONTRAST
TECHNIQUE: Multidetector CT imaging of the abdomen and pelvis was performed
following the standard protocol without IV contrast.

[Series 4: lung bases · axial · 0.71mm/px · z∈[-160,-100]mm · 4 of 22 slices shown, 9 images]
[im 5/22  soft-tissue]
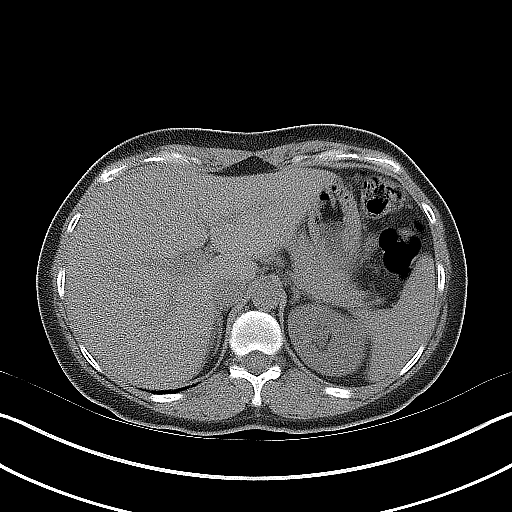
[im 5/22  lung]
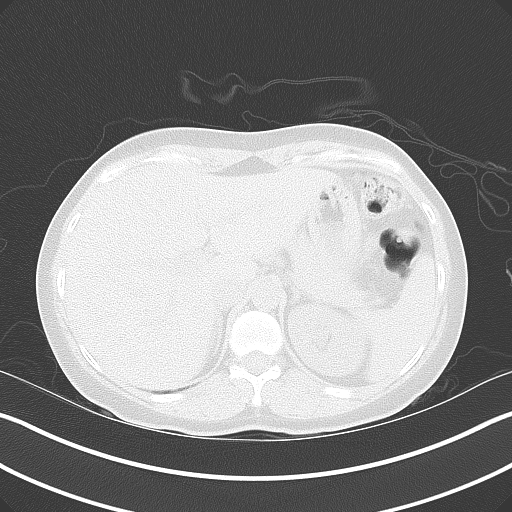
[im 5/22  bone]
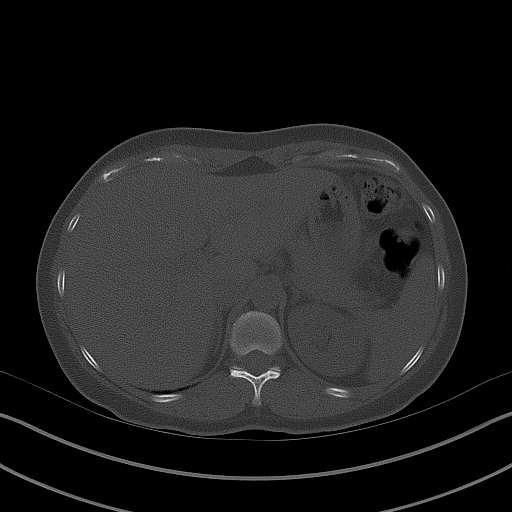
[im 9/22  soft-tissue]
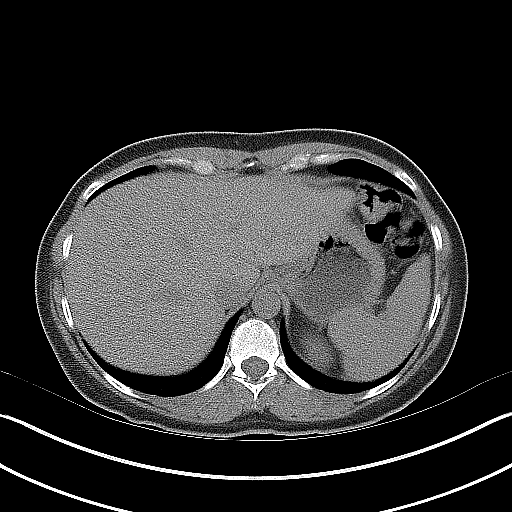
[im 9/22  lung]
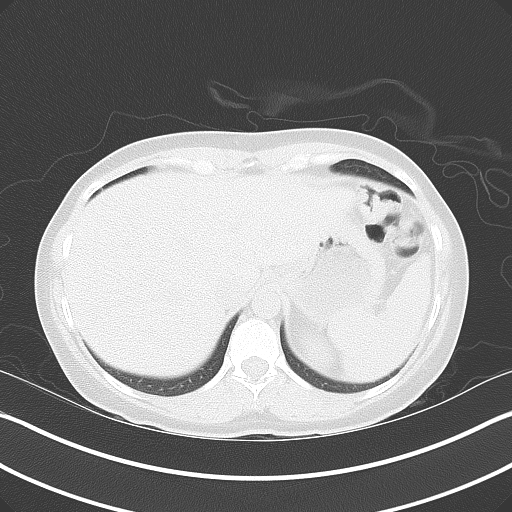
[im 13/22  soft-tissue]
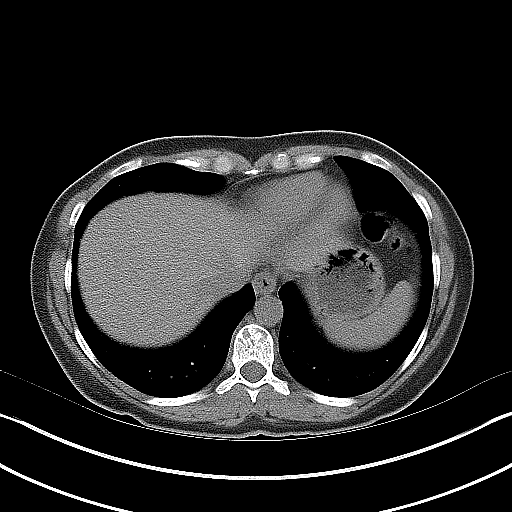
[im 13/22  lung]
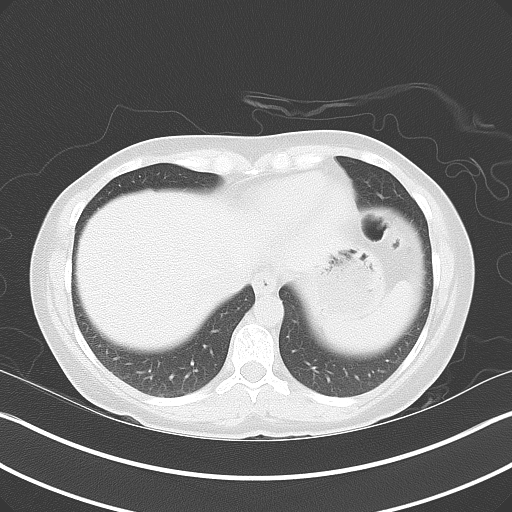
[im 17/22  soft-tissue]
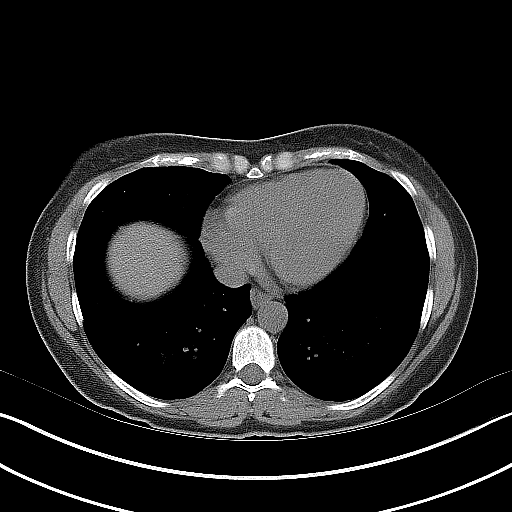
[im 17/22  lung]
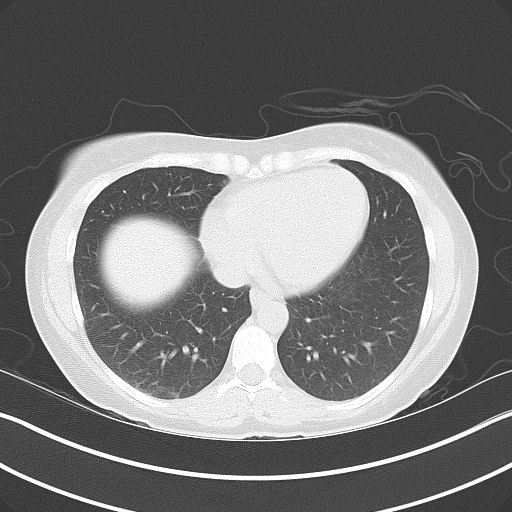

[Series 5: coronal · coronal · 0.68mm/px · 3 of 109 slices shown, 4 images]
[im 37/109  soft-tissue]
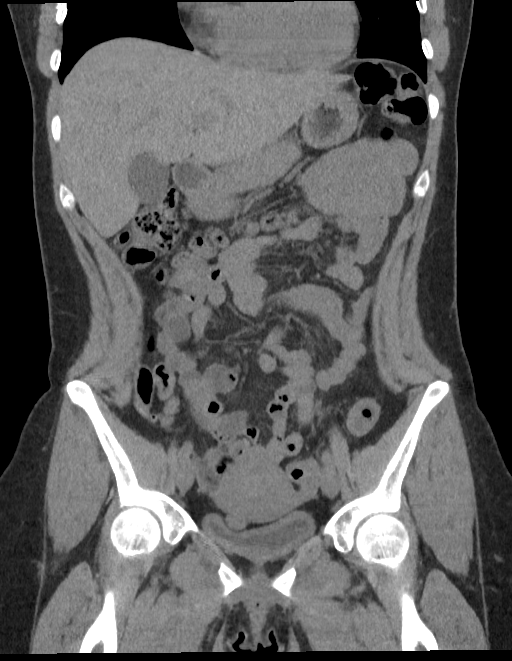
[im 49/109  soft-tissue]
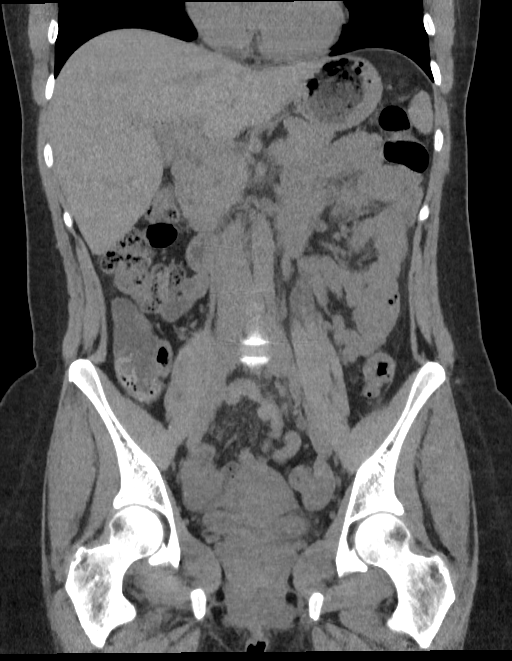
[im 49/109  bone]
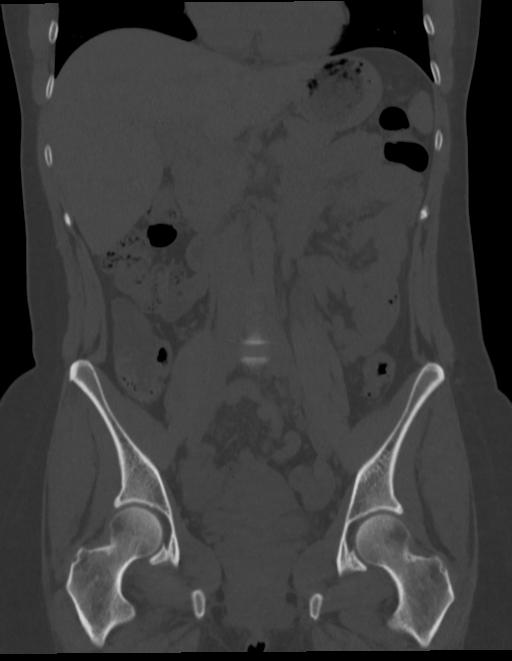
[im 61/109  soft-tissue]
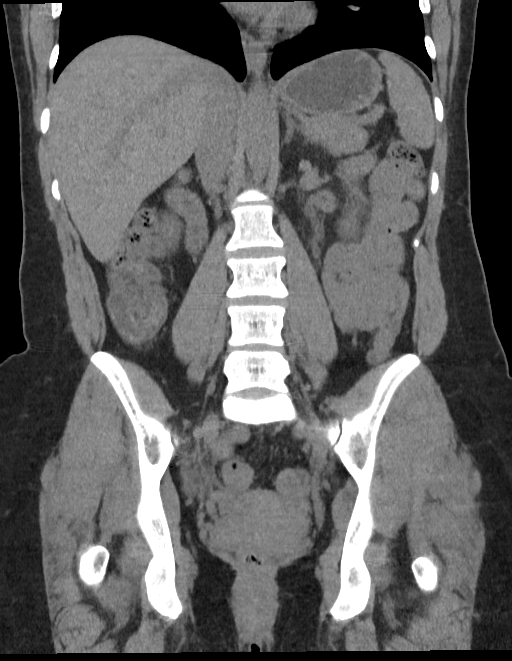

[Series 6: sagittal · sagittal · 0.43mm/px · 1 of 175 slices shown, 2 images]
[im 59/175  soft-tissue]
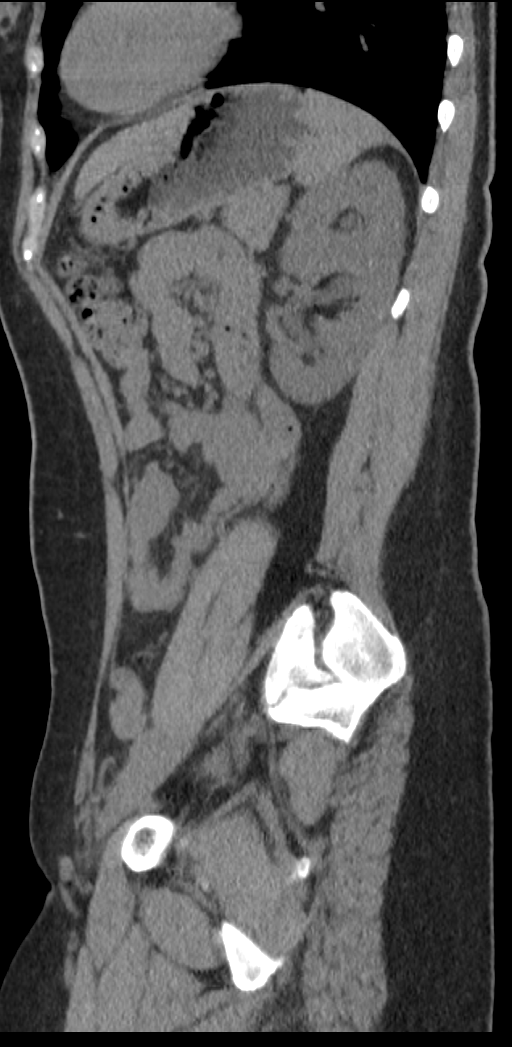
[im 59/175  bone]
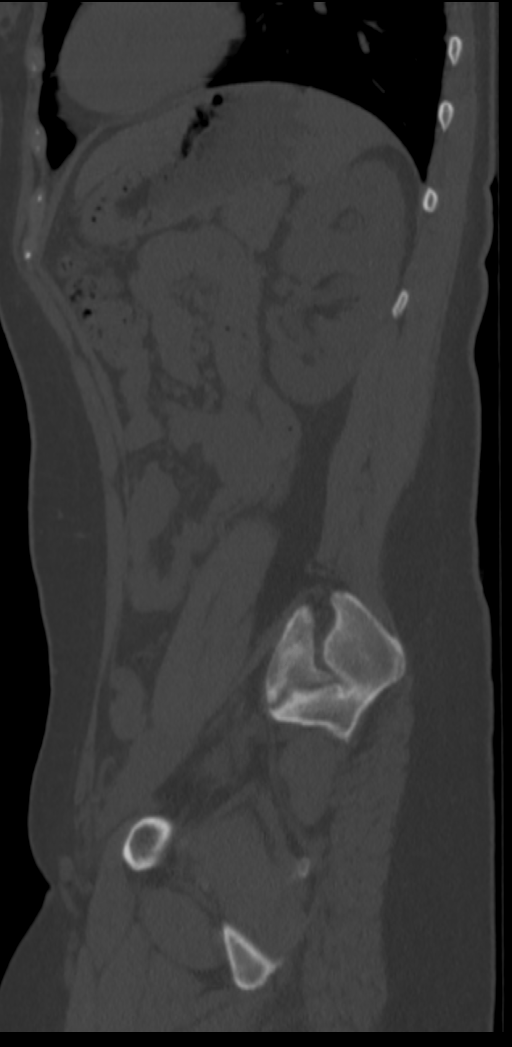

[8 of 46 positions shown; findings below may reference images not displayed]

FINDINGS: Lower chest: No acute abnormality.

Hepatobiliary: No focal liver abnormality is seen. No gallstones,
gallbladder wall thickening, or biliary dilatation.

Pancreas: Unremarkable. No pancreatic ductal dilatation or
surrounding inflammatory changes.

Spleen: Normal in size without focal abnormality.

Adrenals/Urinary Tract: The adrenal glands and right kidney are
unremarkable. 4 mm calculus at the left UVJ with resultant mild
hydroureteronephrosis. Partially duplicated left renal collecting
system. Punctate nonobstructive calculus in the midpole of the left
kidney. The bladder is decompressed.

Stomach/Bowel: Stomach is within normal limits. Appendix appears
normal. No evidence of bowel wall thickening, distention, or
inflammatory changes.

Vascular/Lymphatic: No significant vascular findings are present. No
enlarged abdominal or pelvic lymph nodes.

Reproductive: Small uterine fibroid. The bilateral adnexa are
unremarkable.

Other: No abdominal wall hernia or abnormality. No abdominopelvic
ascites. No pneumoperitoneum.

Musculoskeletal: No acute or significant osseous findings.
IMPRESSION: 1. 4 mm calculus at the left UVJ with resultant mild
hydroureteronephrosis.
2. Additional punctate nonobstructive left renal calculus.

## 2024-10-19 ENCOUNTER — Emergency Department
Admission: EM | Admit: 2024-10-19 | Discharge: 2024-10-20 | Disposition: A | Payer: Self-pay | Attending: Emergency Medicine | Admitting: Emergency Medicine

## 2024-10-19 ENCOUNTER — Encounter: Payer: Self-pay | Admitting: *Deleted

## 2024-10-19 ENCOUNTER — Other Ambulatory Visit: Payer: Self-pay

## 2024-10-19 DIAGNOSIS — R1010 Upper abdominal pain, unspecified: Secondary | ICD-10-CM

## 2024-10-19 DIAGNOSIS — R1011 Right upper quadrant pain: Secondary | ICD-10-CM | POA: Insufficient documentation

## 2024-10-19 LAB — CBC
HCT: 41.6 % (ref 36.0–46.0)
Hemoglobin: 13.7 g/dL (ref 12.0–15.0)
MCH: 28.1 pg (ref 26.0–34.0)
MCHC: 32.9 g/dL (ref 30.0–36.0)
MCV: 85.2 fL (ref 80.0–100.0)
Platelets: 359 K/uL (ref 150–400)
RBC: 4.88 MIL/uL (ref 3.87–5.11)
RDW: 12.9 % (ref 11.5–15.5)
WBC: 10 K/uL (ref 4.0–10.5)
nRBC: 0 % (ref 0.0–0.2)

## 2024-10-19 LAB — URINALYSIS, ROUTINE W REFLEX MICROSCOPIC
Bacteria, UA: NONE SEEN
Bilirubin Urine: NEGATIVE
Glucose, UA: NEGATIVE mg/dL
Ketones, ur: NEGATIVE mg/dL
Leukocytes,Ua: NEGATIVE
Nitrite: NEGATIVE
Protein, ur: NEGATIVE mg/dL
Specific Gravity, Urine: 1.021 (ref 1.005–1.030)
pH: 6 (ref 5.0–8.0)

## 2024-10-19 LAB — COMPREHENSIVE METABOLIC PANEL WITH GFR
ALT: 28 U/L (ref 0–44)
AST: 20 U/L (ref 15–41)
Albumin: 4.2 g/dL (ref 3.5–5.0)
Alkaline Phosphatase: 114 U/L (ref 38–126)
Anion gap: 11 (ref 5–15)
BUN: 14 mg/dL (ref 6–20)
CO2: 25 mmol/L (ref 22–32)
Calcium: 9.8 mg/dL (ref 8.9–10.3)
Chloride: 105 mmol/L (ref 98–111)
Creatinine, Ser: 0.72 mg/dL (ref 0.44–1.00)
GFR, Estimated: >60 mL/min
Glucose, Bld: 103 mg/dL — ABNORMAL HIGH (ref 70–99)
Potassium: 4.4 mmol/L (ref 3.5–5.1)
Sodium: 141 mmol/L (ref 135–145)
Total Bilirubin: <0.2 mg/dL (ref 0.0–1.2)
Total Protein: 7 g/dL (ref 6.5–8.1)

## 2024-10-19 LAB — LIPASE, BLOOD: Lipase: 40 U/L (ref 11–51)

## 2024-10-19 NOTE — ED Triage Notes (Signed)
 Pt ambulatory to triage.  Pt has ruq abd pain pt reports nausea.  No v/d.  No back pain.  Denies urinary sx.  Pt alert.

## 2024-10-20 ENCOUNTER — Emergency Department: Payer: Self-pay

## 2024-10-20 LAB — POC URINE PREG, ED: Preg Test, Ur: NEGATIVE

## 2024-10-20 MED ORDER — IOHEXOL 350 MG/ML SOLN
100.0000 mL | Freq: Once | INTRAVENOUS | Status: AC | PRN
  Administered 2024-10-20: 100 mL via INTRAVENOUS

## 2024-10-20 NOTE — ED Provider Notes (Signed)
 "  [facility] Provider Note   Event Date/Time   First MD Initiated Contact with Patient 10/19/24 2350     (approximate) History  Abdominal Pain  HPI Jean Bates is a 39 y.o. female with past medical history of ADD and ovarian cyst who presents complaining of right upper quadrant abdominal pain that has been present for the last day with associated nausea and no vomiting or diarrhea.  Patient states that she has been having reflux type symptoms for over the past 8 months since having an EGD done in [place].  Patient has been using omeprazole and Tums occasionally however today she woke up with right lower quadrant abdominal pain but has been stable since onset which is unusual for her reflux symptoms.  Patient denies any exacerbating or relieving symptoms however does state that bowel movement somewhat relieves her symptoms.  Patient also endorses loose stools however does not describe diarrhea.  This pain is not related to food ROS: Patient currently denies any vision changes, tinnitus, difficulty speaking, facial droop, sore throat, chest pain, shortness of breath, vomiting/diarrhea, dysuria, or weakness/numbness/paresthesias in any extremity   Physical Exam  Triage Vital Signs: ED Triage Vitals [10/19/24 2224]  Encounter Vitals Group     BP (!) 144/94     Girls Systolic BP Percentile      Girls Diastolic BP Percentile      Boys Systolic BP Percentile      Boys Diastolic BP Percentile      Pulse Rate 78     Resp 18     Temp 99.3 F (37.4 C)     Temp Source Oral     SpO2 97 %     Weight 170 lb (77.1 kg)     Height 5' 4 (1.626 m)     Head Circumference      Peak Flow      Pain Score 6     Pain Loc      Pain Education      Exclude from Growth Chart    Most recent vital signs: Vitals:   10/19/24 2224  BP: (!) 144/94  Pulse: 78  Resp: 18  Temp: 99.3 F (37.4 C)  SpO2: 97%   General: Awake, oriented x4. CV:  Good peripheral perfusion. Resp:  Normal  effort. Abd:  No distention.  Bedside right upper quadrant ultrasound shows no cholelithiasis and no sonographic Murphy sign Other:  Middle-aged overweight Caucasian female resting comfortably in no acute distress ED Results / Procedures / Treatments  Labs (all labs ordered are listed, but only abnormal results are displayed) Labs Reviewed  COMPREHENSIVE METABOLIC PANEL WITH GFR - Abnormal; Notable for the following components:      Result Value   Glucose, Bld 103 (*)    All other components within normal limits  URINALYSIS, ROUTINE W REFLEX MICROSCOPIC - Abnormal; Notable for the following components:   Color, Urine YELLOW (*)    APPearance CLEAR (*)    Hgb urine dipstick MODERATE (*)    All other components within normal limits  LIPASE, BLOOD  CBC  POC URINE PREG, ED   RADIOLOGY ED MD interpretation: CT of the chest/abdomen/pelvis shows no evidence of acute abnormalities - All radiology independently interpreted and agree with radiology assessment Official radiology report(s): CT ABDOMEN PELVIS W CONTRAST Result Date: 10/20/2024 EXAM: CT ABDOMEN AND PELVIS WITH CONTRAST 10/20/2024 01:43:47 AM TECHNIQUE: CT of the abdomen and pelvis was performed with the administration of 100 mL of  iohexol  (OMNIPAQUE ) 350 MG/ML injection. Multiplanar reformatted images are provided for review. Automated exposure control, iterative reconstruction, and/or weight-based adjustment of the mA/kV was utilized to reduce the radiation dose to as low as reasonably achievable. COMPARISON: 04/06/2019 CLINICAL HISTORY: Abdominal pain, acute, nonlocalized; mostly RUQ. FINDINGS: LOWER CHEST: No acute abnormality. LIVER: The liver is unremarkable. GALLBLADDER AND BILE DUCTS: Gallbladder is unremarkable. No biliary ductal dilatation. SPLEEN: No acute abnormality. PANCREAS: No acute abnormality. ADRENAL GLANDS: No acute abnormality. KIDNEYS, URETERS AND BLADDER: No stones in the kidneys or ureters. No hydronephrosis. No  perinephric or periureteral stranding. Urinary bladder is unremarkable. GI AND BOWEL: Stomach demonstrates no acute abnormality. There is no bowel obstruction. Normal appendix. PERITONEUM AND RETROPERITONEUM: No ascites. No free air. VASCULATURE: Aorta is normal in caliber. LYMPH NODES: No lymphadenopathy. REPRODUCTIVE ORGANS: No acute abnormality. BONES AND SOFT TISSUES: No acute osseous abnormality. No focal soft tissue abnormality. IMPRESSION: 1. No acute findings in the abdomen or pelvis. Electronically signed by: Franky Crease MD 10/20/2024 01:47 AM EST RP Workstation: [id number]   CT Angio Chest PE W/Cm &/Or Wo Cm Result Date: 10/20/2024 EXAM: CTA CHEST 10/20/2024 01:43:47 AM TECHNIQUE: CTA of the chest was performed after the administration of intravenous contrast. Multiplanar reformatted images are provided for review. MIP images are provided for review. Automated exposure control, iterative reconstruction, and/or weight based adjustment of the mA/kV was utilized to reduce the radiation dose to as low as reasonably achievable. COMPARISON: None available. CLINICAL HISTORY: Pulmonary embolism (PE) suspected, high prob FINDINGS: PULMONARY ARTERIES: Pulmonary arteries are adequately opacified for evaluation. No acute pulmonary embolus. Main pulmonary artery is normal in caliber. MEDIASTINUM: The heart and pericardium demonstrate no acute abnormality. There is no acute abnormality of the thoracic aorta. LYMPH NODES: No mediastinal, hilar or axillary lymphadenopathy. LUNGS AND PLEURA: The lungs are without acute process. No focal consolidation or pulmonary edema. No evidence of pleural effusion or pneumothorax. UPPER ABDOMEN: Limited images of the upper abdomen are unremarkable. SOFT TISSUES AND BONES: No acute bone or soft tissue abnormality. IMPRESSION: 1. No pulmonary embolism or acute pulmonary abnormality. Electronically signed by: Franky Crease MD 10/20/2024 01:46 AM EST RP Workstation: [id number]    PROCEDURES: Critical Care performed: No Procedures MEDICATIONS ORDERED IN ED: Medications  iohexol  (OMNIPAQUE ) 350 MG/ML injection 100 mL (100 mLs Intravenous Contrast Given 10/20/24 0132)   IMPRESSION / MDM / ASSESSMENT AND PLAN / ED COURSE  I reviewed the triage vital signs and the nursing notes.                             The patient is on the cardiac monitor to evaluate for evidence of arrhythmia and/or significant heart rate changes. Patient's presentation is most consistent with acute presentation with potential threat to life or bodily function. Patient is a 39 year old female with the above-stated past medical history who presents complaining of right upper quadrant abdominal pain after months of reflux type symptoms. DDx: Biliary disease, pancreatitis, gastritis, esophagitis, appendicitis, PE Plan: CBC, CMP, UA, CT abdomen pelvis, CT angiography chest  Patient's laboratory and radiologic evaluations not show any evidence of acute abnormalities.  Patient is p.o. tolerant without difficulty.  Patient does have moderate hemoglobin on her urinalysis and states that she is having some spotting.  Patient agrees with plan for discharge at this time with outpatient gastroenterology follow-up for likely repeat EGD.  Patient given strict return precautions and all questions were answered prior to discharge  Dispo:  Discharge home with PCP follow-up   FINAL CLINICAL IMPRESSION(S) / ED DIAGNOSES   Final diagnoses:  Pain of upper abdomen   Rx / DC Orders   ED Discharge Orders     None      Note:  This document was prepared using Dragon voice recognition software and may include unintentional dictation errors.   Katianna Mcclenney K, MD 10/20/24 0214  "
